# Patient Record
Sex: Male | Born: 1935 | Race: White | Hispanic: No | Marital: Single
Health system: Southern US, Community
[De-identification: ages and names within clinical notes are randomized; demographics above are authoritative.]

---

## 2007-06-19 ENCOUNTER — Emergency Department: Payer: Self-pay | Admitting: Emergency Medicine

## 2007-06-28 ENCOUNTER — Ambulatory Visit: Payer: Self-pay | Admitting: Gastroenterology

## 2007-07-02 ENCOUNTER — Ambulatory Visit: Payer: Self-pay | Admitting: Gastroenterology

## 2007-07-22 ENCOUNTER — Ambulatory Visit: Payer: Self-pay | Admitting: Gastroenterology

## 2008-02-21 ENCOUNTER — Ambulatory Visit: Payer: Self-pay | Admitting: Urology

## 2009-02-11 ENCOUNTER — Ambulatory Visit: Payer: Self-pay | Admitting: Gastroenterology

## 2009-03-16 ENCOUNTER — Ambulatory Visit: Payer: Self-pay | Admitting: Gastroenterology

## 2009-03-27 ENCOUNTER — Emergency Department: Payer: Self-pay | Admitting: Internal Medicine

## 2009-11-26 ENCOUNTER — Inpatient Hospital Stay: Payer: Self-pay | Admitting: Internal Medicine

## 2011-01-21 ENCOUNTER — Inpatient Hospital Stay: Payer: Self-pay | Admitting: Internal Medicine

## 2011-02-24 ENCOUNTER — Ambulatory Visit: Payer: Self-pay | Admitting: Urology

## 2011-02-24 DIAGNOSIS — I10 Essential (primary) hypertension: Secondary | ICD-10-CM

## 2011-03-07 ENCOUNTER — Ambulatory Visit: Payer: Self-pay | Admitting: Urology

## 2011-03-24 ENCOUNTER — Inpatient Hospital Stay: Payer: Self-pay | Admitting: Internal Medicine

## 2011-09-16 ENCOUNTER — Inpatient Hospital Stay: Payer: Self-pay | Admitting: Internal Medicine

## 2011-09-16 LAB — URINALYSIS, COMPLETE
Bilirubin,UR: NEGATIVE
Glucose,UR: NEGATIVE mg/dL (ref 0–75)
Ketone: NEGATIVE
Nitrite: NEGATIVE
Ph: 7 (ref 4.5–8.0)
RBC,UR: 59 /HPF (ref 0–5)
Specific Gravity: 1.025 (ref 1.003–1.030)
Squamous Epithelial: <1
WBC UR: 3 /HPF (ref 0–5)

## 2011-09-16 LAB — COMPREHENSIVE METABOLIC PANEL
Anion Gap: 9 (ref 7–16)
Calcium, Total: 8.3 mg/dL — ABNORMAL LOW (ref 8.5–10.1)
Creatinine: 1.28 mg/dL (ref 0.60–1.30)
EGFR (African American): 60 — ABNORMAL LOW
EGFR (Non-African Amer.): 51 — ABNORMAL LOW
Osmolality: 267 (ref 275–301)
Potassium: 4.3 mmol/L (ref 3.5–5.1)
SGOT(AST): 40 U/L — ABNORMAL HIGH (ref 15–37)
Sodium: 132 mmol/L — ABNORMAL LOW (ref 136–145)
Total Protein: 6.2 g/dL — ABNORMAL LOW (ref 6.4–8.2)

## 2011-09-16 LAB — CBC
HGB: 11.4 g/dL — ABNORMAL LOW (ref 13.0–18.0)
RBC: 4.04 10*6/uL — ABNORMAL LOW (ref 4.40–5.90)
RDW: 14.4 % (ref 11.5–14.5)

## 2011-09-16 LAB — TROPONIN I: Troponin-I: <0.02 ng/mL

## 2011-09-16 LAB — PROTIME-INR: INR: 1.1

## 2011-09-17 LAB — CBC WITH DIFFERENTIAL/PLATELET
Basophil #: 0.1 10*3/uL (ref 0.0–0.1)
Eosinophil #: 0.3 10*3/uL (ref 0.0–0.7)
Eosinophil %: 5.5 %
HGB: 11.1 g/dL — ABNORMAL LOW (ref 13.0–18.0)
Lymphocyte #: 1.2 10*3/uL (ref 1.0–3.6)
Monocyte #: 0.6 x10 3/mm (ref 0.2–1.0)
Platelet: 179 10*3/uL (ref 150–440)
RBC: 3.98 10*6/uL — ABNORMAL LOW (ref 4.40–5.90)
RDW: 14.5 % (ref 11.5–14.5)

## 2011-09-17 LAB — COMPREHENSIVE METABOLIC PANEL
Alkaline Phosphatase: 93 U/L (ref 50–136)
Anion Gap: 8 (ref 7–16)
Calcium, Total: 8.1 mg/dL — ABNORMAL LOW (ref 8.5–10.1)
Chloride: 102 mmol/L (ref 98–107)
Creatinine: 1.11 mg/dL (ref 0.60–1.30)
EGFR (African American): >60
Potassium: 4.3 mmol/L (ref 3.5–5.1)
SGOT(AST): 26 U/L (ref 15–37)
Total Protein: 5.8 g/dL — ABNORMAL LOW (ref 6.4–8.2)

## 2012-12-30 IMAGING — CT CT HEAD WITHOUT CONTRAST
2 series · 16 of 30 positions shown, 20 images · non-contrast
Comparison: none

REASON FOR EXAM: fall. laceration to top of head
COMMENTS:

PROCEDURE:     CT  - CT HEAD WITHOUT CONTRAST  - September 16, 2011  [DATE]
RESULT:     Comparison:  None
TECHNIQUE: Multiple axial images from the foramen magnum to the vertex were
obtained without IV contrast.

[Series 2: without · axial · non-contrast · 0.44mm/px · z∈[+210,+334]mm · 13 of 31 slices shown, 17 images]
[im 3/31  brain]
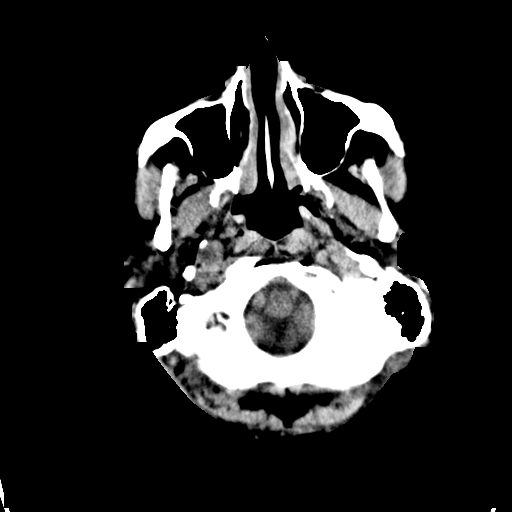
[im 3/31  bone]
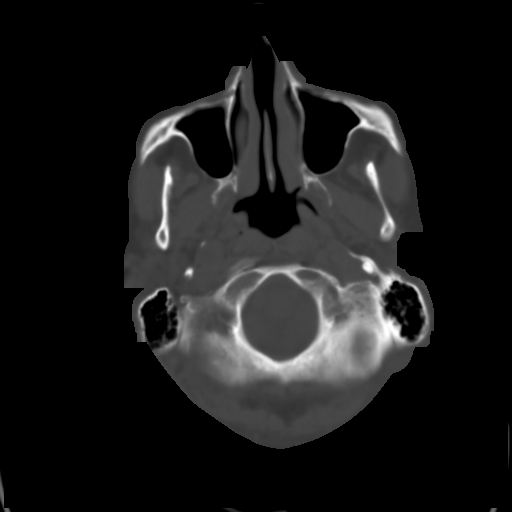
[im 5/31  brain]
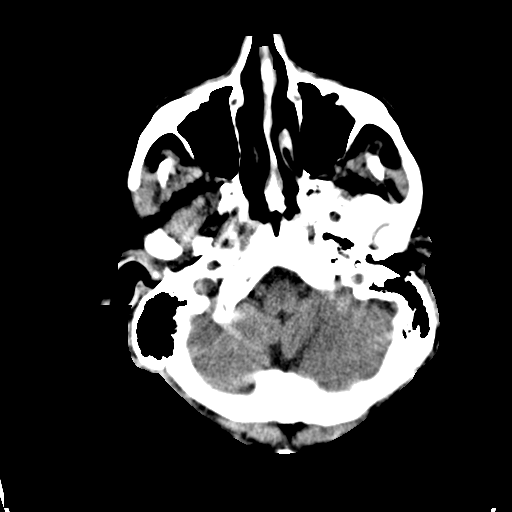
[im 7/31  brain]
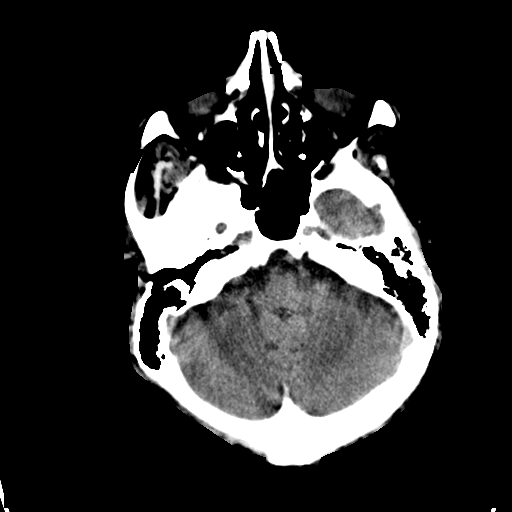
[im 9/31  brain]
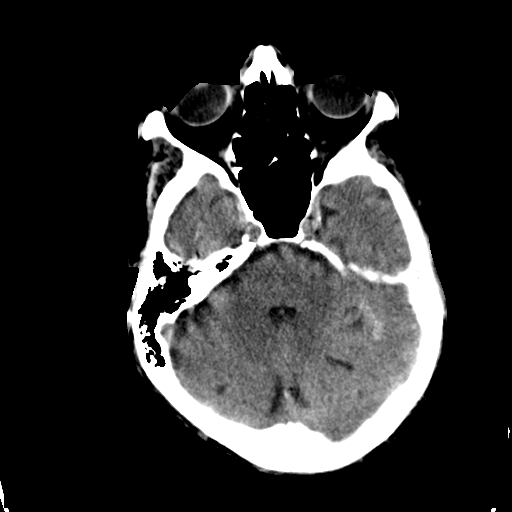
[im 11/31  brain]
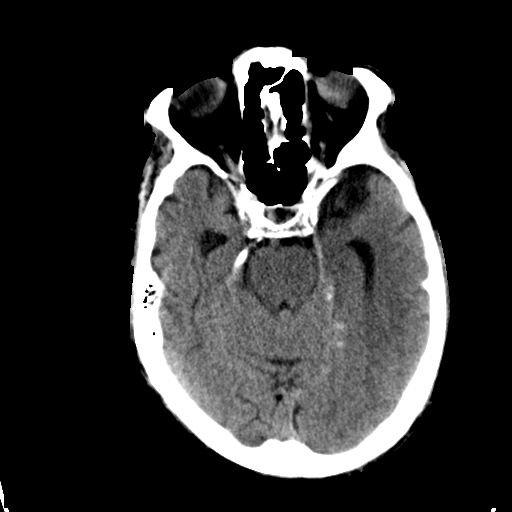
[im 11/31  bone]
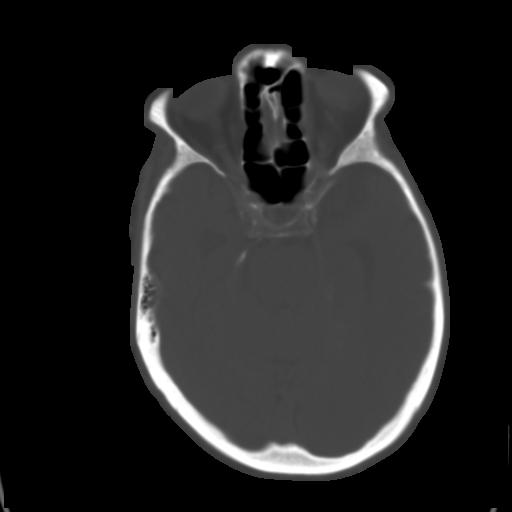
[im 13/31  brain]
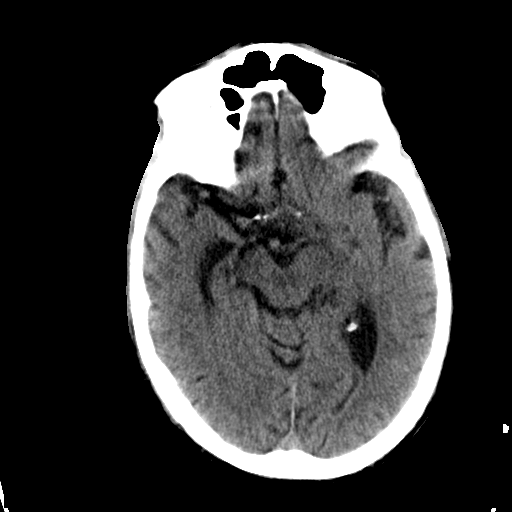
[im 16/31  brain]
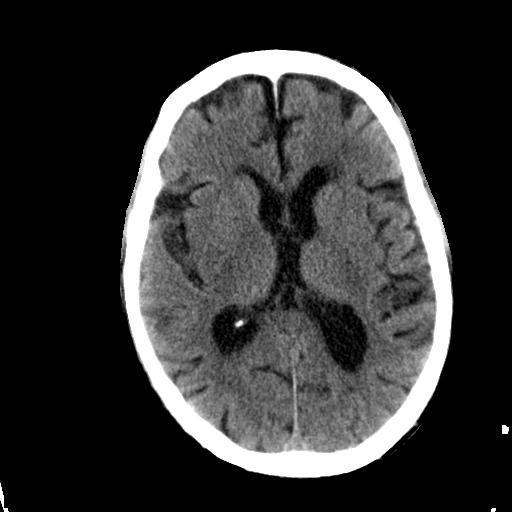
[im 18/31  brain]
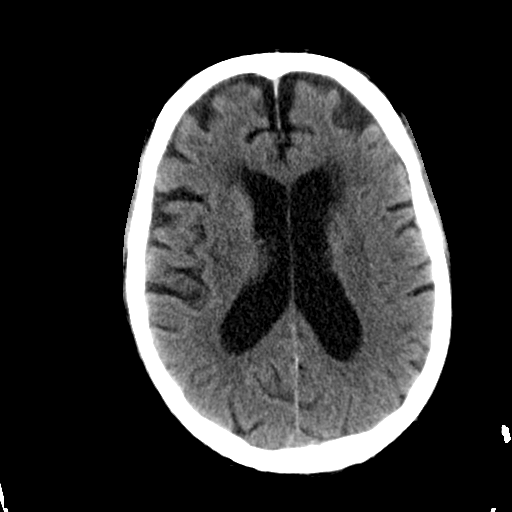
[im 20/31  brain]
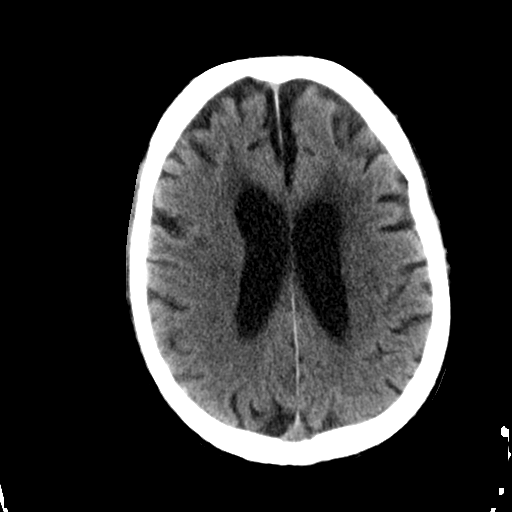
[im 20/31  bone]
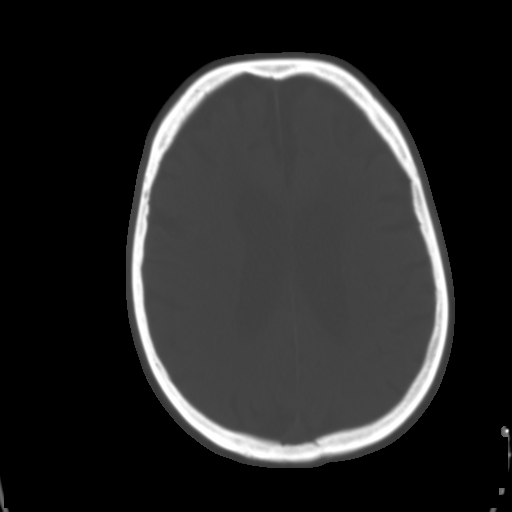
[im 22/31  brain]
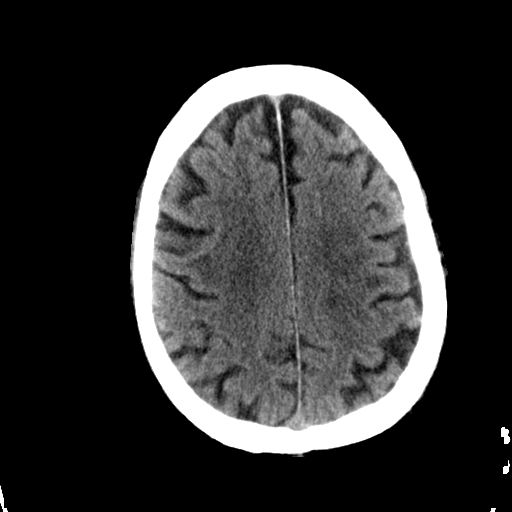
[im 24/31  brain]
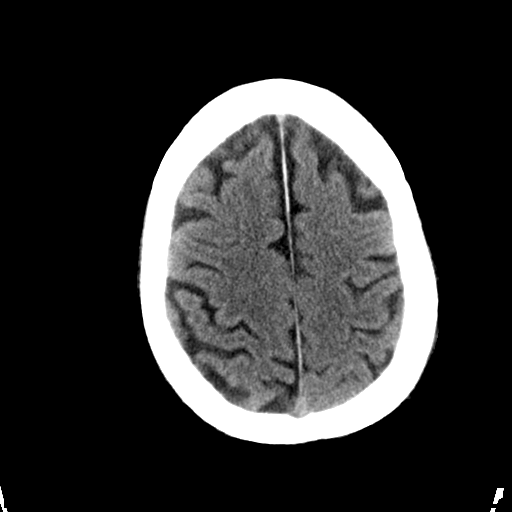
[im 26/31  brain]
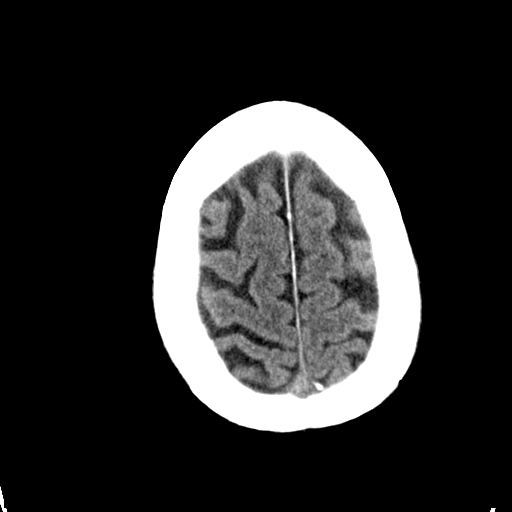
[im 28/31  brain]
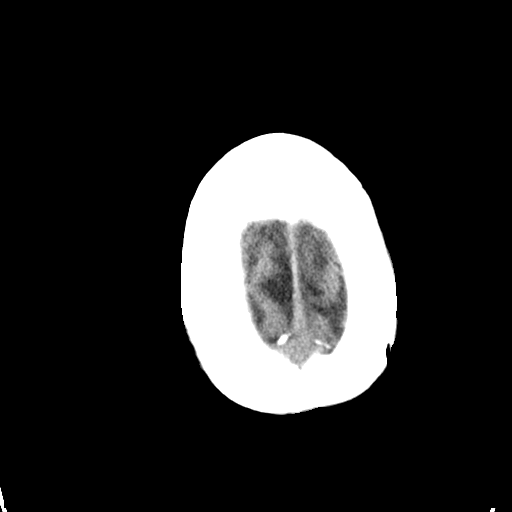
[im 28/31  bone]
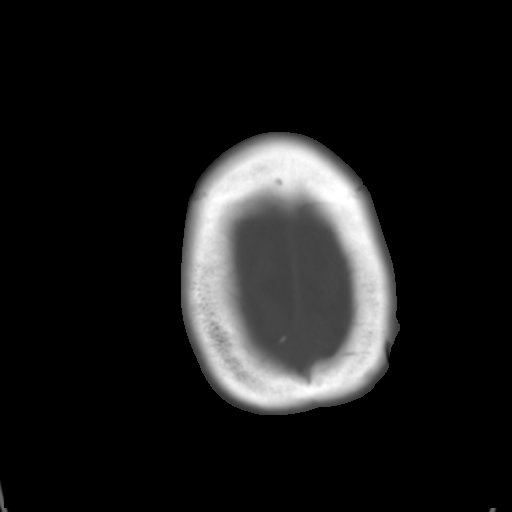

[Series 3: bone · axial · 0.44mm/px · z∈[+210,+250]mm · 3 of 31 slices shown]
[im 3/31  bone]
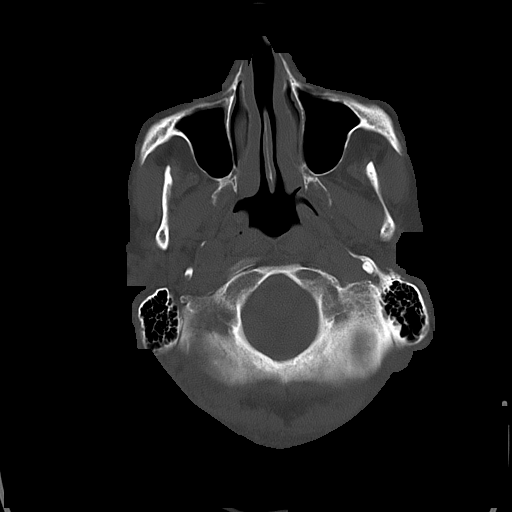
[im 7/31  bone]
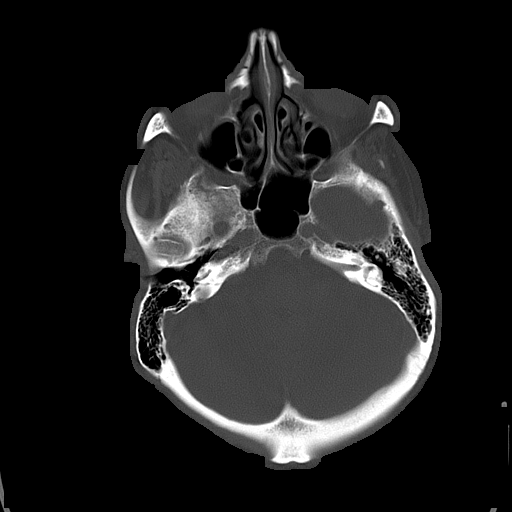
[im 11/31  bone]
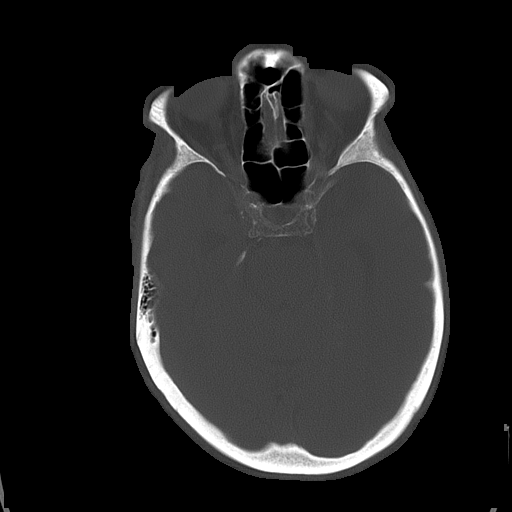

[16 of 30 positions shown; findings below may reference images not displayed]

FINDINGS: There is no evidence for mass effect, midline shift, or extra-axial fluid
collections. There is no evidence for space-occupying lesion, intracranial
hemorrhage, or cortical-based area of infarction. Periventricular and
subcortical hypoattenuation is consistent with chronic small vessel ischemic
disease. Small focal areas of hyperdensity along the left tentorium are felt
to be secondary to calcification along the tentorium. There is a laceration
along the left posterior parietal scalp.

The osseous structures are unremarkable.
IMPRESSION: 1. No acute intracranial process.
2. Chronic small vessel ischemic disease.

## 2014-03-31 DEATH — deceased

## 2014-08-23 NOTE — Discharge Summary (Signed)
PATIENT NAME:  Ray Copeland, Ray Copeland MR#:  [id number] DATE OF BIRTH:  Jan 14, 1926  DATE OF ADMISSION:  09/16/2011 DATE OF DISCHARGE:  09/18/2011  DIAGNOSES:  1. Recurrent syncope with falls, likely due to orthostatic hypotension.  2. Scalp laceration.  3. Constipation.  4. History of recurrent chronic urinary tract infections. 5. Dementia with depression. 6. Benign prostatic hypertrophy.  7. Normocytic anemia. 8. Hypoglycemia. Chronic hyponatremia.  9. Hypertension.   DISPOSITION: The patient is being discharged home with Home Health.   DIET: Low sodium.   ACTIVITY: As tolerated.   FOLLOWUP: Follow up with PCP 1 to 2 weeks after discharge.   DISCHARGE MEDICATIONS:  1. Fluoxetine 10 mg daily.  2. Amlodipine 2.5 mg daily.  3. Ferrous sulfate 325 mg daily.  4. Flomax 0.4 mg daily.   CODE STATUS: DO NOT RESUSCITATE.   LABORATORY, DIAGNOSTIC, AND RADIOLOGICAL DATA: Microbiology: Urine culture- mixed bacterial organisms. White count normal. Hemoglobin 11.4,  platelet count normal. CT scan of the cervical spine showed no acute abnormalities. CT chest, abdomen and pelvis showed no acute abnormalities. CT of the head showed no acute abnormalities. Sodium 132 to 135. Normal BUN and creatinine and electrolytes. Normal LFTs. Normal cardiac enzymes. Echocardiogram:  Normal ejection fraction.  No evidence of systolic or diastolic dysfunction.  HOSPITAL COURSE: The patient is an 81 year old male with past medical history of benign prostatic hypertrophy, recurrent UTIs, and hypertension who presented with syncope. The patient has had recurrent syncope and multiple falls. He had extensive work-up including MRI of the brain, carotid ultrasound, and CT head, all of which were negative. His cardiac enzymes were negative and his echo was normal. Orthostatics were checked and the patient was orthostatic.  He had fallen at home and had a scalp laceration, which was stapled by the ED physician. There was no  evidence of bleeding or discharge. As a result of his orthostasis the patient's atenolol has been discontinued. He will just continue his low dose amlodipine. The rest of the patient's medical problems remained stable. The patient's brother, who is his POA, was comfortable taking him home. He was going to arrange for assisted living facility placement as an outpatient. The patient was discharged home in stable condition.   TIME SPENT: 45 minutes.    ____________________________ Darrick MeigsSangeeta Ainslie Mazurek, MD sp:bjt D: 09/19/2011 16:06:36 ET T: 09/20/2011 10:18:49 ET JOB#: [id number]  cc: Darrick MeigsSangeeta Nyjae Hodge, MD, <Dictator> Darrick MeigsSANGEETA Tykeem Lanzer MD ELECTRONICALLY SIGNED 09/20/2011 15:42

## 2014-08-23 NOTE — H&P (Signed)
PATIENT NAME:  Ray Copeland, STOREY MR#:  [id number] DATE OF BIRTH:  07-19-1925  DATE OF ADMISSION:  09/16/2011  CHIEF COMPLAINT: Syncope with head laceration.   HISTORY OF PRESENT ILLNESS: Mr. Leverette is an 81 year old white male with a history of dementia, hypertension, coronary artery disease with prior coronary artery bypass graft and chronic hyponatremia who was found by his caregiver this morning with a large lacerated parietal scalp which occurred during an unwitnessed fall the evening prior to admission. Per the patient's brother, Sharon Seller, who is his health care power of attorney, Mr. Flaum has had five falls in the last three weeks, four of which were unwitnessed including the one last night. The one witnessed fall had occurred after the patient went to the restroom and was walking back to his bedroom. He was witnessed to lose consciousness prior to falling and then fell. The patient's family feels that each of these falls has occurred with a blackout prior to the patient hitting the ground. The patient does not remember of the events of last night. He was in his usual state of health. He does use a walker inside the house and on one of his falls he fell forward on the walker bruising both sides of his ribs anteriorly. The patient denies any chest pain, shortness of breath, but has been more tired for the last several months. He has been becoming progressively weaker per his brother, Sharon Seller. He does have a caregiver that is with him throughout the day, but is left unsupervised in the evenings. The caregiver is responsible for cleaning, bathing, preparing meals and giving meds. There is no option for providing evening care per family.   PRIMARY CARE PHYSICIAN:  Dr. Karrie Doffing, [facility]. He was last seen two weeks ago for treatment of a urinary tract infection which has been problematic since his TURP procedure in November.   PAST MEDICAL HISTORY:    1. History of benign prostatic hypertrophy  with prior renal failure and hydronephrosis and underwent TURP procedure by Dr. Achilles Dunk in November which was complicated by urinary retention and need for Foley catheter. The patient is currently voiding spontaneously.  2. Hypertension.  3. Coronary artery disease, status post coronary artery bypass graft over 20 years ago.  4. Schatzki's ring. Last dilation 2011 by Dr. Mechele Collin.  5. Chronic hyponatremia.  6. Diverticulosis by 2010 colonoscopy.   CURRENT MEDICATIONS:  1. Tramadol 50 mg 1 tablet every six hours as needed for pain.  2. Norco 5/325, one to two tablets every four hours as needed for pain. 3. Nitrofurantoin 1 tablet daily.  4. Mirtazapine 15 mg daily at bedtime.  5. Flomax 0.4 mg daily.  6. Ferrous sulfate 325 mg daily.  7. Atenolol 25 mg once daily.  8. Per the patient's healthcare power of attorney, he was previously taking doxycycline up until the nitrofurantoin was ordered and this is supposed to be twice a day.   PAST SURGICAL HISTORY:  1. Cholecystectomy. 2. Appendectomy. 3. Coronary artery bypass graft. 4. KTP laser prostatectomy/TURP done November 2012 by Dr. Achilles Dunk.   ALLERGIES: He has no known allergies.   LAST HOSPITALIZATION: November 2012 for nausea, vomiting, and dehydration with urinary tract infection. Prior admission in September 2012 for urinary tract infection, acute renal failure and prostatic hypertrophy with outlet obstruction.   FAMILY HISTORY: Notable for father who died of colon cancer and mother had heart disease.   SOCIAL HISTORY: He has lived alone for all of his adult life. He never  married.   REVIEW OF SYSTEMS: Positive for fatigue, weakness, and neck pain. No blurred or double vision. No history of ear pain, hearing loss or seasonal rhinitis. No history of cough, wheezing, hemoptysis, dyspnea, or arrhythmias. No history of chest pain, orthopnea, or edema. No history of recent episodes of nausea, vomiting, diarrhea, or abdominal pain. No history of  incontinence. No history of polyuria, nocturia, or thyroid problems. No history of anemia or easy bruising or bleeding. He does have current neck pain secondary to recent fall and bilateral rib pain secondary to recurrent falls. Neurological review of systems is positive for weakness and memory loss. He has no history of seizures or migraines. He does have history of recurrent syncope per history of present illness. He has no history of anxiety or depression, but does have insomnia and takes Remeron for that.   PHYSICAL EXAMINATION:  VITAL SIGNS: Orthostatic vital signs done by ED department shows a supine blood pressure of 144/63 and a standing blood pressure of 120/56. Pulse goes from 64 to 71. Oxygen saturations are 95% on room air.   GENERAL: This is slightly cachectic, elderly male in no apparent distress. Scalp has a 4 to 5 cm linear gash which has been sutured with staples, still oozing slightly.   HEENT: Pupils are equal, round, and reactive to light. Extraocular movements are intact. Oropharynx is benign.   NECK: Supple without lymphadenopathy, JVD, thyromegaly, or carotid bruits.   LUNGS: Clear to auscultation bilaterally with no wheezing or rhonchi.   CARDIOVASCULAR: Regular rate and rhythm. No murmurs, rubs, or gallops.   ABDOMEN: Soft, tender to palpation over the right anterior lower ribs. No masses or bruising. No hepatosplenomegaly. He is moving all four extremities without issue. He does have some bruising along the extensor surface of the right forearm and right elbow and a purpuric lesion at the elbow as well. There is no cervical, axillary, inguinal, or supraclavicular lymphadenopathy.   NEUROLOGICAL: Cranial nerves appear to be intact. Deep tendon reflexes are intact. Sensation is intact. Gait was not tested. He is alert and oriented to person and place. Memory is impaired.   ADMISSION LABORATORY DATA: Sodium 132, potassium 4.3, chloride 99, bicarbonate 24, BUN 16, creatinine  1.28, glucose 115, white count 5.2, hemoglobin 11.4, platelets 175, MCV is 85. Liver function tests normal except for AST slightly elevated at 40. ProTime is 14.1. Troponin I is less than 0.02. EKG shows normal sinus rhythm with chronic T wave inversion in the lateral leads. CT of the spine shows no acute fractures. 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 CT of the chest, abdomen and pelvis shows a stable 3 mm right middle lobe nodule and a 4 mm right lower lobe nodule. Stable common bile dilatation which may be secondary to prior cholecystectomy and constipation. Also, a nonobstructive 5 mm ureteral stone. CT of the head shows no acute changes and chronic small vessel disease. There is a left posterior parietal scalp lesion.   ASSESSMENT AND PLAN:  1. Recurrent syncope. I suspect either autonomic hypotension with concurrent carotid artery disease causing loss of consciousness due to cerebral underperfusion. We will admit to regular bed. Obtain MRI of brain and carotid Doppler's. The patient is unsafe to return home unsupervised and will need placement at nursing home facility. The patient's brother, Sharon Seller, has power of attorney.  2. Scalp laceration. The patient has had the laceration sutured in the ER. CT of the head  shows no acute hemorrhagic changes other than the scalp laceration.  3. Constipation. We will add stool softener and fiber laxative for management.  4. History of recurrent/chronic urinary tract infection per Dr. Achilles Dunkope. The patient is supposed to be on Macrobid currently. Will repeat his urine as it has been two weeks since he has had this checked.  5. Dementia with chronic small vessel disease seen on the CT of the head. Alzheimer's is unlikely and therefore treatment with Alzheimer's medications unlikely to help. He will need a higher level of care than what can be provided at home. Placement advised and accepted by  brother. 6. CODE STATUS: The patient has been DO NOT RESUSCITATE at last admission and this was again reviewed again with brother who maintains that DO NOT RESUSCITATE status is still in effect.   ESTIMATED TIME OF CARE: 60 minutes.  ____________________________ Duncan Dulleresa Money Mckeithan, MD tt:ap D: 09/16/2011 16:21:54 ET T: 09/17/2011 08:07:33 ET JOB#: [id number]  cc: Duncan Dulleresa Larz Mark, MD, <Dictator> Karrie DoffingWilliam Selvidge, MD Duncan DullERESA Ciclaly Mulcahey MD ELECTRONICALLY SIGNED 09/20/2011 17:59
